# Patient Record
Sex: Female | Born: 1965 | Race: White | Hispanic: No | Marital: Married | Smoking: Former smoker
Health system: Southern US, Community
[De-identification: ages and names within clinical notes are randomized; demographics above are authoritative.]

## PROBLEM LIST (undated history)

## (undated) DIAGNOSIS — T8859XA Other complications of anesthesia, initial encounter: Secondary | ICD-10-CM

## (undated) DIAGNOSIS — K219 Gastro-esophageal reflux disease without esophagitis: Secondary | ICD-10-CM

## (undated) DIAGNOSIS — E039 Hypothyroidism, unspecified: Secondary | ICD-10-CM

## (undated) DIAGNOSIS — T4145XA Adverse effect of unspecified anesthetic, initial encounter: Secondary | ICD-10-CM

## (undated) HISTORY — DX: Hypothyroidism, unspecified: E03.9

## (undated) HISTORY — PX: LUNG CANCER SURGERY: SHX702

## (undated) HISTORY — PX: LUNG SURGERY: SHX703

---
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
## 2001-02-20 ENCOUNTER — Inpatient Hospital Stay (HOSPITAL_COMMUNITY): Admission: AD | Admit: 2001-02-20 | Discharge: 2001-02-25 | Payer: Self-pay | Admitting: Thoracic Surgery

## 2001-02-21 ENCOUNTER — Encounter: Payer: Self-pay | Admitting: Thoracic Surgery

## 2001-02-22 ENCOUNTER — Encounter: Payer: Self-pay | Admitting: Thoracic Surgery

## 2001-02-23 ENCOUNTER — Encounter: Payer: Self-pay | Admitting: Thoracic Surgery

## 2001-02-24 ENCOUNTER — Encounter: Payer: Self-pay | Admitting: Thoracic Surgery

## 2001-02-25 ENCOUNTER — Encounter: Payer: Self-pay | Admitting: Thoracic Surgery

## 2001-02-28 ENCOUNTER — Encounter: Admission: RE | Admit: 2001-02-28 | Discharge: 2001-02-28 | Payer: Self-pay | Admitting: Thoracic Surgery

## 2001-02-28 ENCOUNTER — Encounter: Payer: Self-pay | Admitting: Thoracic Surgery

## 2001-03-28 ENCOUNTER — Encounter: Admission: RE | Admit: 2001-03-28 | Discharge: 2001-03-28 | Payer: Self-pay | Admitting: Thoracic Surgery

## 2001-03-28 ENCOUNTER — Encounter: Payer: Self-pay | Admitting: Thoracic Surgery

## 2005-09-14 ENCOUNTER — Encounter: Admission: RE | Admit: 2005-09-14 | Discharge: 2005-09-14 | Payer: Self-pay | Admitting: General Surgery

## 2014-01-24 DIAGNOSIS — C3490 Malignant neoplasm of unspecified part of unspecified bronchus or lung: Secondary | ICD-10-CM

## 2014-01-24 HISTORY — DX: Malignant neoplasm of unspecified part of unspecified bronchus or lung: C34.90

## 2014-08-08 ENCOUNTER — Other Ambulatory Visit (HOSPITAL_COMMUNITY): Payer: Self-pay | Admitting: Family Medicine

## 2014-08-08 DIAGNOSIS — R918 Other nonspecific abnormal finding of lung field: Secondary | ICD-10-CM

## 2014-08-18 ENCOUNTER — Ambulatory Visit (HOSPITAL_COMMUNITY): Admission: RE | Admit: 2014-08-18 | Payer: 59 | Source: Ambulatory Visit

## 2014-08-27 ENCOUNTER — Encounter (HOSPITAL_COMMUNITY)
Admission: RE | Admit: 2014-08-27 | Discharge: 2014-08-27 | Disposition: A | Discharge status: Home / Self Care | Payer: 59 | Source: Ambulatory Visit | Attending: Family Medicine | Admitting: Family Medicine

## 2014-08-27 DIAGNOSIS — R911 Solitary pulmonary nodule: Secondary | ICD-10-CM | POA: Diagnosis not present

## 2014-08-27 DIAGNOSIS — J8410 Pulmonary fibrosis, unspecified: Secondary | ICD-10-CM | POA: Insufficient documentation

## 2014-08-27 DIAGNOSIS — R918 Other nonspecific abnormal finding of lung field: Secondary | ICD-10-CM | POA: Insufficient documentation

## 2014-08-27 LAB — GLUCOSE, CAPILLARY: GLUCOSE-CAPILLARY: 88 mg/dL (ref 65–99)

## 2014-08-27 MED ORDER — FLUDEOXYGLUCOSE F - 18 (FDG) INJECTION
6.5000 | Freq: Once | INTRAVENOUS | Status: AC | PRN
  Administered 2014-08-27: 6.5 via INTRAVENOUS

## 2015-11-03 IMAGING — CT NM PET TUM IMG INITIAL (PI) SKULL BASE T - THIGH
1 of 7 series · 1 of 25 positions shown · non-contrast
Comparison: None.

CLINICAL DATA: Initial Treatment strategy for Lung cancer.

EXAM:
NUCLEAR MEDICINE PET SKULL BASE TO THIGH
TECHNIQUE: 6.5 mCi F-18 FDG was injected intravenously. Full-ring PET imaging
was performed from the skull base to thigh after the radiotracer. CT
data was obtained and used for attenuation correction and anatomic
localization.
FASTING BLOOD GLUCOSE:  Value: 88 mg/dl

[Series 4: ct sk_thigh 5.0 b31f · axial · 5.0mm · 0.83mm/px · 1 of 220 slices shown]
[im 220/220  brain]
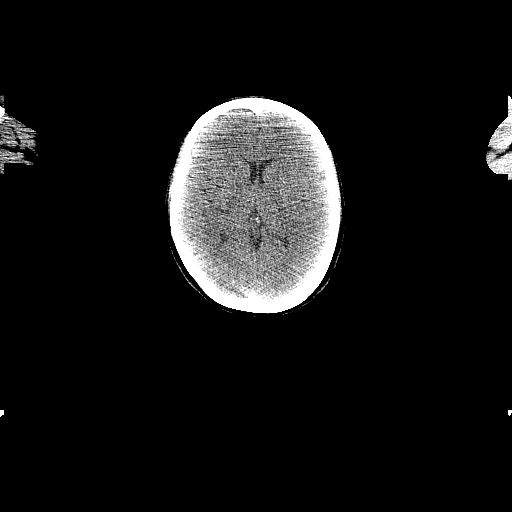

[1 of 25 positions shown; findings below may reference images not displayed]

FINDINGS: NECK

No hypermetabolic lymph nodes in the neck.

CHEST

Right upper lobe lung mass measures 4.4 cm in maximum dimension and
has a SUV max equal to 24.9. There is direct invasion into the
posterior chest wall with destruction of a portion of the third rib.
Hypermetabolic pleural lesion is identified along the medial apex
measuring 8 mm. This has an SUV max equal to 7.2 and is worrisome
for trans pleural spread of tumor.

No hypermetabolic mediastinal or hilar lymph nodes. Calcified
granuloma is identified in the right upper lobe.

ABDOMEN/PELVIS

No abnormal hypermetabolic activity within the liver, pancreas,
adrenal glands, or spleen. No hypermetabolic lymph nodes in the
abdomen or pelvis.

SKELETON

No focal hypermetabolic activity to suggest skeletal metastasis.
IMPRESSION: 1. Invasive mass involving the posterior right upper lobe is
intensely hypermetabolic and compatible with primary bronchogenic
carcinoma. There is direct invasion into the posterior chest wall
with destruction of the right third rib.
2. Second pleural nodule is identified along the medial apex of the
right lung are worrisome for trans pleural spread of tumor.
3. No evidence for distant metastatic disease.

## 2018-06-28 ENCOUNTER — Encounter: Payer: Self-pay | Admitting: *Deleted

## 2018-08-29 ENCOUNTER — Ambulatory Visit: Payer: 59

## 2018-10-22 ENCOUNTER — Ambulatory Visit (INDEPENDENT_AMBULATORY_CARE_PROVIDER_SITE_OTHER): Payer: Self-pay | Admitting: *Deleted

## 2018-10-22 ENCOUNTER — Other Ambulatory Visit: Payer: Self-pay

## 2018-10-22 DIAGNOSIS — Z1211 Encounter for screening for malignant neoplasm of colon: Secondary | ICD-10-CM

## 2018-10-22 MED ORDER — NA SULFATE-K SULFATE-MG SULF 17.5-3.13-1.6 GM/177ML PO SOLN: 1.0000 | Freq: Once | ORAL | 0 refills | Status: AC

## 2018-10-22 NOTE — Patient Instructions (Signed)
Tanya Woods  May 31, 1965 MRN: [id number]     Procedure Date: 01/15/2019 Time to register: 9:30 am Place to register: [facility] Procedure Time: 10:30 am Scheduled provider: Dr. Gala Romney    PREPARATION FOR COLONOSCOPY WITH SUPREP BOWEL PREP KIT  Note: Suprep Bowel Prep Kit is a split-dose (2day) regimen. Consumption of BOTH 6-ounce bottles is required for a complete prep.  Please notify us immediately if you are diabetic, take iron supplements, or if you are on Coumadin or any other blood thinners.                                                                                                                                                  2 DAYS BEFORE PROCEDURE:  DATE: 01/13/2019  DAY: Sunday Begin clear liquid diet AFTER your lunch meal. NO SOLID FOODS after this point.  1 DAY BEFORE PROCEDURE:  DATE: 01/14/2019   DAY: Monday Continue clear liquids the entire day - NO SOLID FOOD.    At 6:00pm: Complete steps 1 through 4 below, using ONE (1) 6-ounce bottle, before going to bed. Step 1:  Pour ONE (1) 6-ounce bottle of SUPREP liquid into the mixing container.  Step 2:  Add cool drinking water to the 16 ounce line on the container and mix.  Note: Dilute the solution concentrate as directed prior to use. Step 3:  DRINK ALL the liquid in the container. Step 4:  You MUST drink an additional two (2) or more 16 ounce containers of water over the next one (1) hour.   Continue clear liquids.  DAY OF PROCEDURE:   DATE: 01/15/2019   DAY:  Tuesday If you take medications for your heart, blood pressure, or breathing, you may take these medications.    5 hours before your procedure at :  5:30 am Step 1:  Pour ONE (1) 6-ounce bottle of SUPREP liquid into the mixing container.  Step 2:  Add cool drinking water to the 16 ounce line on the container and mix.  Note: Dilute the solution concentrate as directed prior to use. Step 3:  DRINK ALL the liquid in the container. Step 4:  You  MUST drink an additional two (2) or more 16 ounce containers of water over the next one (1) hour. You MUST complete the final glass of water at least 3 hours before your colonoscopy. Nothing by mouth past 7:30 am  You may take your morning medications with sip of water unless we have instructed otherwise.    Please see below for Dietary Information.  CLEAR LIQUIDS INCLUDE:  Water Jello (NOT red in color)   Ice Popsicles (NOT red in color)   Tea (sugar ok, no milk/cream) Powdered fruit flavored drinks  Coffee (sugar ok, no milk/cream) Gatorade/ Lemonade/ Kool-Aid  (NOT red in color)   Juice: apple, white grape, white cranberry  Soft drinks  Clear bullion, consomme, broth (fat free beef/chicken/vegetable)  Carbonated beverages (any kind)  Strained chicken noodle soup Hard Candy   Remember: Clear liquids are liquids that will allow you to see your fingers on the other side of a clear glass. Be sure liquids are NOT red in color, and not cloudy, but CLEAR.  DO NOT EAT OR DRINK ANY OF THE FOLLOWING:  Dairy products of any kind   Cranberry juice Tomato juice / V8 juice   Grapefruit juice Orange juice     Red grape juice  Do not eat any solid foods, including such foods as: cereal, oatmeal, yogurt, fruits, vegetables, creamed soups, eggs, bread, crackers, pureed foods in a blender, etc.   HELPFUL HINTS FOR DRINKING PREP SOLUTION:   Make sure prep is extremely cold. Mix and refrigerate the the morning of the prep. You may also put in the freezer.   You may try mixing some Crystal Light or Country Time Lemonade if you prefer. Mix in small amounts; add more if necessary.  Try drinking through a straw  Rinse mouth with water or a mouthwash between glasses, to remove after-taste.  Try sipping on a cold beverage /ice/ popsicles between glasses of prep.  Place a piece of sugar-free hard candy in mouth between glasses.  If you become nauseated, try consuming smaller amounts, or stretch out  the time between glasses. Stop for 30-60 minutes, then slowly start back drinking.     OTHER INSTRUCTIONS  You will need a responsible adult at least 51 years of age to accompany you and drive you home. This person must remain in the waiting room during your procedure. The hospital will cancel your procedure if you do not have a responsible adult with you.   1. Wear loose fitting clothing that is easily removed. 2. Leave jewelry and other valuables at home.  3. Remove all body piercing jewelry and leave at home. 4. Total time from sign-in until discharge is approximately 2-3 hours. 5. You should go home directly after your procedure and rest. You can resume normal activities the day after your procedure. 6. The day of your procedure you should not:  Drive  Make legal decisions  Operate machinery  Drink alcohol  Return to work   You may call the office (Dept: [phone]) before 5:00pm, or page the doctor on call [phone]) after 5:00pm, for further instructions, if necessary.   Insurance Information YOU WILL NEED TO CHECK WITH YOUR INSURANCE COMPANY FOR THE BENEFITS OF COVERAGE YOU HAVE FOR THIS PROCEDURE.  UNFORTUNATELY, NOT ALL INSURANCE COMPANIES HAVE BENEFITS TO COVER ALL OR PART OF THESE TYPES OF PROCEDURES.  IT IS YOUR RESPONSIBILITY TO CHECK YOUR BENEFITS, HOWEVER, WE WILL BE GLAD TO ASSIST YOU WITH ANY CODES YOUR INSURANCE COMPANY MAY NEED.    PLEASE NOTE THAT MOST INSURANCE COMPANIES WILL NOT COVER A SCREENING COLONOSCOPY FOR PEOPLE UNDER THE AGE OF 50  IF YOU HAVE BCBS INSURANCE, YOU MAY HAVE BENEFITS FOR A SCREENING COLONOSCOPY BUT IF POLYPS ARE FOUND THE DIAGNOSIS WILL CHANGE AND THEN YOU MAY HAVE A DEDUCTIBLE THAT WILL NEED TO BE MET. SO PLEASE MAKE SURE YOU CHECK YOUR BENEFITS FOR A SCREENING COLONOSCOPY AS WELL AS A DIAGNOSTIC COLONOSCOPY.

## 2018-10-22 NOTE — Progress Notes (Addendum)
Gastroenterology Pre-Procedure Review  Request Date: 10/22/2018 Requesting Physician: Dr. Quillian Quince, no previous TCS  PATIENT REVIEW QUESTIONS: The patient responded to the following health history questions as indicated:    1. Diabetes Melitis: no 2. Joint replacements in the past 12 months: no 3. Major health problems in the past 3 months: no 4. Has an artificial valve or MVP: no 5. Has a defibrillator: no 6. Has been advised in past to take antibiotics in advance of a procedure like teeth cleaning: no 7. Family history of colon cancer: no  8. Alcohol Use: no 9. Illicit drug Use: no 10. History of sleep apnea: no  11. History of coronary artery or other vascular stents placed within the last 12 months: no 12. History of any prior anesthesia complications: yes, hard to wake 4 years ago 13. There is no height or weight on file to calculate BMI. ht: 5'6  Wt: 177 lbs    MEDICATIONS & ALLERGIES:    Patient reports the following regarding taking any blood thinners:   Plavix? no Aspirin? no Coumadin? no Brilinta? no Xarelto? no Eliquis? no Pradaxa? no Savaysa? no Effient? no  Patient confirms/reports the following medications:  Current Outpatient Medications  Medication Sig Dispense Refill  . diphenhydrAMINE (BENADRYL ALLERGY) 25 mg capsule Take 25 mg by mouth every 6 (six) hours as needed. Takes 1 to 2 tablets daily.    Marland Kitchen levothyroxine (SYNTHROID) 50 MCG tablet Take 50 mcg by mouth daily before breakfast.    . Multiple Vitamins-Minerals (MULTIVITAMIN ADULT) TABS Take by mouth daily.    . naproxen sodium (ALEVE) 220 MG tablet Take 220 mg by mouth as needed.    Marland Kitchen omeprazole (PRILOSEC) 20 MG capsule Take 20 mg by mouth daily.     No current facility-administered medications for this visit.     Patient confirms/reports the following allergies:  Allergies  Allergen Reactions  . Morphine And Related Nausea Only  . Tramadol Nausea Only    No orders of the defined types were  placed in this encounter.   [place] INFORMATION Primary Insurance: [facility],  [place] #: [id number],  Group #: [id number] Pre-Cert / Auth required: Yes, approved online 01-15-2019 to [phone] Pre-Cert / Auth #: [id number]  SCHEDULE INFORMATION: Procedure has been scheduled as follows:  Date: 01/15/2019, Time: 10:30 Location: [facility] with Dr. Gala Romney  This Gastroenterology Pre-Precedure Review Form is being routed to the following provider(s): Roseanne Kaufman, NP

## 2018-10-24 NOTE — Progress Notes (Signed)
What procedure was difficult with anesthesia? May be best to just to see her in the office.

## 2018-10-25 NOTE — Progress Notes (Addendum)
Called pt and the procedure was due to lung cancer.

## 2018-10-29 NOTE — Progress Notes (Signed)
Appropriate. I reviewed Care Everywhere, and there was no mention of this as an issue.

## 2018-10-30 NOTE — Addendum Note (Signed)
Addended by: Metro Kung on: 10/30/2018 12:42 PM   Modules accepted: Orders, SmartSet

## 2019-01-11 ENCOUNTER — Other Ambulatory Visit (HOSPITAL_COMMUNITY)
Admission: RE | Admit: 2019-01-11 | Discharge: 2019-01-11 | Disposition: A | Discharge status: Home / Self Care | Payer: 59 | Source: Ambulatory Visit | Attending: Internal Medicine | Admitting: Internal Medicine

## 2019-01-11 ENCOUNTER — Other Ambulatory Visit: Payer: Self-pay

## 2019-01-11 ENCOUNTER — Other Ambulatory Visit (HOSPITAL_COMMUNITY): Payer: 59

## 2019-01-11 DIAGNOSIS — Z20828 Contact with and (suspected) exposure to other viral communicable diseases: Secondary | ICD-10-CM | POA: Diagnosis not present

## 2019-01-11 DIAGNOSIS — Z01812 Encounter for preprocedural laboratory examination: Secondary | ICD-10-CM | POA: Diagnosis not present

## 2019-01-11 LAB — SARS CORONAVIRUS 2 (TAT 6-24 HRS): SARS Coronavirus 2: NEGATIVE

## 2019-01-15 ENCOUNTER — Encounter (HOSPITAL_COMMUNITY)
Admission: RE | Disposition: A | Discharge status: Home / Self Care | Payer: Self-pay | Source: Home / Self Care | Attending: Internal Medicine

## 2019-01-15 ENCOUNTER — Encounter (HOSPITAL_COMMUNITY): Payer: Self-pay | Admitting: Internal Medicine

## 2019-01-15 ENCOUNTER — Other Ambulatory Visit: Payer: Self-pay

## 2019-01-15 ENCOUNTER — Ambulatory Visit (HOSPITAL_COMMUNITY)
Admission: RE | Admit: 2019-01-15 | Discharge: 2019-01-15 | Disposition: A | Discharge status: Home / Self Care | Payer: 59 | Attending: Internal Medicine | Admitting: Internal Medicine

## 2019-01-15 DIAGNOSIS — Z79890 Hormone replacement therapy (postmenopausal): Secondary | ICD-10-CM | POA: Insufficient documentation

## 2019-01-15 DIAGNOSIS — D120 Benign neoplasm of cecum: Secondary | ICD-10-CM | POA: Insufficient documentation

## 2019-01-15 DIAGNOSIS — Z885 Allergy status to narcotic agent status: Secondary | ICD-10-CM | POA: Insufficient documentation

## 2019-01-15 DIAGNOSIS — K219 Gastro-esophageal reflux disease without esophagitis: Secondary | ICD-10-CM | POA: Insufficient documentation

## 2019-01-15 DIAGNOSIS — Z1211 Encounter for screening for malignant neoplasm of colon: Secondary | ICD-10-CM | POA: Diagnosis not present

## 2019-01-15 DIAGNOSIS — K621 Rectal polyp: Secondary | ICD-10-CM | POA: Diagnosis not present

## 2019-01-15 DIAGNOSIS — K635 Polyp of colon: Secondary | ICD-10-CM | POA: Diagnosis not present

## 2019-01-15 DIAGNOSIS — D124 Benign neoplasm of descending colon: Secondary | ICD-10-CM | POA: Insufficient documentation

## 2019-01-15 DIAGNOSIS — Z79899 Other long term (current) drug therapy: Secondary | ICD-10-CM | POA: Insufficient documentation

## 2019-01-15 DIAGNOSIS — Z87891 Personal history of nicotine dependence: Secondary | ICD-10-CM | POA: Diagnosis not present

## 2019-01-15 HISTORY — PX: COLONOSCOPY: SHX5424

## 2019-01-15 HISTORY — DX: Other complications of anesthesia, initial encounter: T88.59XA

## 2019-01-15 HISTORY — DX: Gastro-esophageal reflux disease without esophagitis: K21.9

## 2019-01-15 HISTORY — PX: BIOPSY: SHX5522

## 2019-01-15 HISTORY — PX: POLYPECTOMY: SHX5525

## 2019-01-15 SURGERY — COLONOSCOPY
Anesthesia: Moderate Sedation

## 2019-01-15 MED ORDER — ONDANSETRON HCL 4 MG/2ML IJ SOLN
INTRAMUSCULAR | Status: AC
  Filled 2019-01-15: qty 2

## 2019-01-15 MED ORDER — MIDAZOLAM HCL 5 MG/5ML IJ SOLN
INTRAMUSCULAR | Status: AC
  Filled 2019-01-15: qty 10

## 2019-01-15 MED ORDER — MEPERIDINE HCL 50 MG/ML IJ SOLN
INTRAMUSCULAR | Status: AC
  Filled 2019-01-15: qty 1

## 2019-01-15 MED ORDER — SODIUM CHLORIDE 0.9 % IV SOLN: INTRAVENOUS | Status: DC

## 2019-01-15 MED ORDER — MEPERIDINE HCL 100 MG/ML IJ SOLN
INTRAMUSCULAR | Status: DC | PRN
  Administered 2019-01-15: 15 mg via INTRAVENOUS
  Administered 2019-01-15: 25 mg via INTRAVENOUS

## 2019-01-15 MED ORDER — ONDANSETRON HCL 4 MG/2ML IJ SOLN
INTRAMUSCULAR | Status: DC | PRN
  Administered 2019-01-15: 4 mg via INTRAVENOUS

## 2019-01-15 MED ORDER — STERILE WATER FOR IRRIGATION IR SOLN
Status: DC | PRN
  Administered 2019-01-15: 1.5 mL

## 2019-01-15 MED ORDER — MIDAZOLAM HCL 5 MG/5ML IJ SOLN
INTRAMUSCULAR | Status: DC | PRN
  Administered 2019-01-15 (×6): 1 mg via INTRAVENOUS
  Administered 2019-01-15: 2 mg via INTRAVENOUS

## 2019-01-15 NOTE — H&P (Signed)
@LOGO @   Primary Care Physician:  Caryl Bis, MD Primary Gastroenterologist:  Dr. Gala Romney  Pre-Procedure History & Physical: HPI:  Tanya Woods is a 53 y.o. female is here for a screening colonoscopy.  No bowel symptoms.  No family history of colon cancer.  No prior colonoscopy.  Past Medical History:  Diagnosis Date  . Complication of anesthesia   . GERD (gastroesophageal reflux disease)     Prior to Admission medications   Medication Sig Start Date End Date Taking? Authorizing Provider  diphenhydrAMINE (BENADRYL ALLERGY) 25 mg capsule Take 25 mg by mouth every 6 (six) hours as needed. Takes 1 to 2 tablets daily.   Yes [provider]  levothyroxine (SYNTHROID) 50 MCG tablet Take 50 mcg by mouth daily before breakfast.   Yes [provider]  Multiple Vitamins-Minerals (MULTIVITAMIN ADULT) TABS Take 1 tablet by mouth daily.    Yes [provider]  naproxen sodium (ALEVE) 220 MG tablet Take 220 mg by mouth 2 (two) times daily as needed (pain).    Yes [provider]  omeprazole (PRILOSEC) 20 MG capsule Take 20 mg by mouth daily.   Yes [provider]    Allergies as of 10/30/2018 - Review Complete 08/27/2014  Allergen Reaction Noted  . Morphine and related Nausea Only 10/22/2018  . Tramadol Nausea Only 10/22/2018    No family history on file.  Social History   Socioeconomic History  . Marital status: Married    Spouse name: Not on file  . Number of children: Not on file  . Years of education: Not on file  . Highest education level: Not on file  Occupational History  . Not on file  Tobacco Use  . Smoking status: Former Research scientist (life sciences)  . Smokeless tobacco: Never Used  Substance and Sexual Activity  . Alcohol use: Yes    Comment: occasion  . Drug use: Never  . Sexual activity: Not on file  Other Topics Concern  . Not on file  Social History Narrative  . Not on file   Social Determinants of Health   Financial Resource  Strain:   . Difficulty of Paying Living Expenses: Not on file  Food Insecurity:   . Worried About Charity fundraiser in the Last Year: Not on file  . Ran Out of Food in the Last Year: Not on file  Transportation Needs:   . Lack of Transportation (Medical): Not on file  . Lack of Transportation (Non-Medical): Not on file  Physical Activity:   . Days of Exercise per Week: Not on file  . Minutes of Exercise per Session: Not on file  Stress:   . Feeling of Stress : Not on file  Social Connections:   . Frequency of Communication with Friends and Family: Not on file  . Frequency of Social Gatherings with Friends and Family: Not on file  . Attends Religious Services: Not on file  . Active Member of Clubs or Organizations: Not on file  . Attends Archivist Meetings: Not on file  . Marital Status: Not on file  Intimate Partner Violence:   . Fear of Current or Ex-Partner: Not on file  . Emotionally Abused: Not on file  . Physically Abused: Not on file  . Sexually Abused: Not on file    Review of Systems: See HPI, otherwise negative ROS  Physical Exam: BP 116/65   Pulse 79   Temp (!) 97.5 F (36.4 C) (Oral)   Resp 19  Ht 5\' 6"  (1.676 m)   SpO2 99%  General:   Alert,  Well-developed, well-nourished, pleasant and cooperative in NAD Head:  Normocephalic and atraumatic. Lungs:  Clear throughout to auscultation.   No wheezes, crackles, or rhonchi. No acute distress. Heart:  Regular rate and rhythm; no murmurs, clicks, rubs,  or gallops. Abdomen:  Soft, nontender and nondistended. No masses, hepatosplenomegaly or hernias noted. Normal bowel sounds, without guarding, and without rebound.    Impression/Plan: Tanya Woods is now here to undergo a screening colonoscopy.  First ever average rescreening examination.  Risks, benefits, limitations, imponderables and alternatives regarding colonoscopy have been reviewed with the patient. Questions have been answered. All parties  agreeable.     Notice:  This dictation was prepared with Dragon dictation along with smaller phrase technology. Any transcriptional errors that result from this process are unintentional and may not be corrected upon review.

## 2019-01-15 NOTE — Discharge Instructions (Signed)
Colonoscopy Discharge Instructions  Read the instructions outlined below and refer to this sheet in the next few weeks. These discharge instructions provide you with general information on caring for yourself after you leave the hospital. Your doctor may also give you specific instructions. While your treatment has been planned according to the most current medical practices available, unavoidable complications occasionally occur. If you have any problems or questions after discharge, call Dr. Gala Romney at [phone]. ACTIVITY  You may resume your regular activity, but move at a slower pace for the next 24 hours.   Take frequent rest periods for the next 24 hours.   Walking will help get rid of the air and reduce the bloated feeling in your belly (abdomen).   No driving for 24 hours (because of the medicine (anesthesia) used during the test).    Do not sign any important legal documents or operate any machinery for 24 hours (because of the anesthesia used during the test).  NUTRITION  Drink plenty of fluids.   You may resume your normal diet as instructed by your doctor.   Begin with a light meal and progress to your normal diet. Heavy or fried foods are harder to digest and may make you feel sick to your stomach (nauseated).   Avoid alcoholic beverages for 24 hours or as instructed.  MEDICATIONS  You may resume your normal medications unless your doctor tells you otherwise.  WHAT YOU CAN EXPECT TODAY  Some feelings of bloating in the abdomen.   Passage of more gas than usual.   Spotting of blood in your stool or on the toilet paper.  IF YOU HAD POLYPS REMOVED DURING THE COLONOSCOPY:  No aspirin products for 7 days or as instructed.   No alcohol for 7 days or as instructed.   Eat a soft diet for the next 24 hours.  FINDING OUT THE RESULTS OF YOUR TEST Not all test results are available during your visit. If your test results are not back during the visit, make an appointment  with your caregiver to find out the results. Do not assume everything is normal if you have not heard from your caregiver or the medical facility. It is important for you to follow up on all of your test results.  SEEK IMMEDIATE MEDICAL ATTENTION IF:  You have more than a spotting of blood in your stool.   Your belly is swollen (abdominal distention).   You are nauseated or vomiting.   You have a temperature over 101.   You have abdominal pain or discomfort that is severe or gets worse throughout the day.     Colon polyp information provided  Further recommendations to follow pending review of pathology report  No future MRI until clips gone  You may experience some mild rectal bleeding with your next bowel movement or 2 but it should taper off  Patient request, I spoke to [place] at [phone] and reviewed results   Colon Polyps  Polyps are tissue growths inside the body. Polyps can grow in many places, including the large intestine (colon). A polyp may be a round bump or a mushroom-shaped growth. You could have one polyp or several. Most colon polyps are noncancerous (benign). However, some colon polyps can become cancerous over time. Finding and removing the polyps early can help prevent this. What are the causes? The exact cause of colon polyps is not known. What increases the risk? You are more likely to develop this condition if you:  Have a  family history of colon cancer or colon polyps.  Are older than 69 or older than 45 if you are African American.  Have inflammatory bowel disease, such as ulcerative colitis or Crohn's disease.  Have certain hereditary conditions, such as: ? Familial adenomatous polyposis. ? Lynch syndrome. ? Turcot syndrome. ? Peutz-Jeghers syndrome.  Are overweight.  Smoke cigarettes.  Do not get enough exercise.  Drink too much alcohol.  Eat a diet that is high in fat and red meat and low in fiber.  Had childhood cancer that was  treated with abdominal radiation. What are the signs or symptoms? Most polyps do not cause symptoms. If you have symptoms, they may include:  Blood coming from your rectum when having a bowel movement.  Blood in your stool. The stool may look dark red or black.  Abdominal pain.  A change in bowel habits, such as constipation or diarrhea. How is this diagnosed? This condition is diagnosed with a colonoscopy. This is a procedure in which a lighted, flexible scope is inserted into the anus and then passed into the colon to examine the area. Polyps are sometimes found when a colonoscopy is done as part of routine cancer screening tests. How is this treated? Treatment for this condition involves removing any polyps that are found. Most polyps can be removed during a colonoscopy. Those polyps will then be tested for cancer. Additional treatment may be needed depending on the results of testing. Follow these instructions at home: Lifestyle  Maintain a healthy weight, or lose weight if recommended by your health care provider.  Exercise every day or as told by your health care provider.  Do not use any products that contain nicotine or tobacco, such as cigarettes and e-cigarettes. If you need help quitting, ask your health care provider.  If you drink alcohol, limit how much you have: ? 0-1 drink a day for women. ? 0-2 drinks a day for men.  Be aware of how much alcohol is in your drink. In the [place] one drink equals one 12 oz bottle of beer (355 mL), one 5 oz glass of wine (148 mL), or one 1 oz shot of hard liquor (44 mL). Eating and drinking   Eat foods that are high in fiber, such as fruits, vegetables, and whole grains.  Eat foods that are high in calcium and vitamin D, such as milk, cheese, yogurt, eggs, liver, fish, and broccoli.  Limit foods that are high in fat, such as fried foods and desserts.  Limit the amount of red meat and processed meat you eat, such as hot dogs,  sausage, bacon, and lunch meats. General instructions  Keep all follow-up visits as told by your health care provider. This is important. ? This includes having regularly scheduled colonoscopies. ? Talk to your health care provider about when you need a colonoscopy. Contact a health care provider if:  You have new or worsening bleeding during a bowel movement.  You have new or increased blood in your stool.  You have a change in bowel habits.  You lose weight for no known reason. Summary  Polyps are tissue growths inside the body. Polyps can grow in many places, including the colon.  Most colon polyps are noncancerous (benign), but some can become cancerous over time.  This condition is diagnosed with a colonoscopy.  Treatment for this condition involves removing any polyps that are found. Most polyps can be removed during a colonoscopy. This information is not intended to replace advice given  to you by your health care provider. Make sure you discuss any questions you have with your health care provider. Document Released: 10/07/2003 Document Revised: 04/27/2017 Document Reviewed: 04/27/2017 Elsevier Patient Education  2020 [employer].

## 2019-01-15 NOTE — Op Note (Signed)
[facility] Patient Name: Tanya Woods Procedure Date: 01/15/2019 9:47 AM MRN: [id number] Date of Birth: May 20, 1965 Attending MD: Norvel Richards , MD CSN: [id number] Age: 53 Admit Type: Outpatient Procedure:                Colonoscopy Indications:              Screening for colorectal malignant neoplasm Providers:                Norvel Richards, MD, Otis Peak B. Sharon Seller, RN,                            Randa Spike, Technician Referring MD:              Medicines:                Midazolam 8 mg IV, Meperidine 40 mg IV Complications:            No immediate complications. Estimated Blood Loss:     Estimated blood loss was minimal. Procedure:                Pre-Anesthesia Assessment:                           - Prior to the procedure, a History and Physical                            was performed, and patient medications and                            allergies were reviewed. The patient's tolerance of                            previous anesthesia was also reviewed. The risks                            and benefits of the procedure and the sedation                            options and risks were discussed with the patient.                            All questions were answered, and informed consent                            was obtained. Prior Anticoagulants: The patient has                            taken no previous anticoagulant or antiplatelet                            agents. ASA Grade Assessment: II - A patient with                            mild systemic disease. After reviewing the risks  and benefits, the patient was deemed in                            satisfactory condition to undergo the procedure.                           After obtaining informed consent, the colonoscope                            was passed under direct vision. Throughout the                            procedure, the patient's blood pressure, pulse, and                           oxygen saturations were monitored continuously. The                            CF-HQ190L ([id number]) scope was introduced through                            the anus and advanced to the the cecum, identified                            by appendiceal orifice and ileocecal valve. The                            colonoscopy was performed without difficulty. The                            patient tolerated the procedure well. The quality                            of the bowel preparation was adequate. Scope In: 10:13:16 AM Scope Out: 10:47:07 AM Scope Withdrawal Time: 0 hours 30 minutes 2 seconds  Total Procedure Duration: 0 hours 33 minutes 51 seconds  Findings:      The perianal and digital rectal examinations were normal. 1 cm irregular       firm nodule just inside the anal verge. Firm to biopsy forcep palpation.       Subtle on DRE      Two semi-pedunculated polyps were found in the descending colon and       cecum. The polyps were 4 to 6 mm in size. Removed with cold snare.      A 14 mm polyp was found in the cecum. The polyp was sessile. Total of 8       cc of Eloview injected into the large cecal polypectomy site. Lifted       nicely. The polyp was removed with a hot snare. X2 passes with complete       resection. Estimated blood loss: Minimal.. Clipped x2 to ensure       hemostasis      An area of nodular mucosa was found in the distal rectum. Cold biopsy       x1. Estimated blood loss minimal      The exam was otherwise without  abnormality on direct and retroflexion       views. Impression:               - Two 4 to 6 mm polyps in the descending colon and                            in the cecum.                           - One 14 mm polyp in the cecum, removed with a hot                            snare. Resected and retrieved. Status post clipping                           - Nodule -distal rectum. S/P biopsy                           - The examination was  otherwise normal on direct                            and retroflexion views. Moderate Sedation:      Moderate (conscious) sedation was administered by the endoscopy nurse       and supervised by the endoscopist. The following parameters were       monitored: oxygen saturation, heart rate, blood pressure, respiratory       rate, EKG, adequacy of pulmonary ventilation, and response to care.       Total physician intraservice time was 39 minutes. Recommendation:           - Patient has a contact number available for                            emergencies. The signs and symptoms of potential                            delayed complications were discussed with the                            patient. Return to normal activities tomorrow.                            Written discharge instructions were provided to the                            patient.                           - Advance diet as tolerated.                           - Continue present medications. No MRI until clips                            gone.                           -  Repeat colonoscopy date to be determined after                            pending pathology results are reviewed for                            surveillance.                           - Return to GI office (date not yet determined).                            Follow-up on pathology. Procedure Code(s):        --- Professional ---                           [phone], Colonoscopy, flexible; with removal of                            tumor(s), polyp(s), or other lesion(s) by snare                            technique                           99153, Moderate sedation; each additional 15                            minutes intraservice time                           99153, Moderate sedation; each additional 15                            minutes intraservice time                           G0500, Moderate sedation services provided by the                             same physician or other qualified health care                            professional performing a gastrointestinal                            endoscopic service that sedation supports,                            requiring the presence of an independent trained                            observer to assist in the monitoring of the                            patient's level of consciousness and physiological  status; initial 15 minutes of intra-service time;                            patient age 67 years or older (additional time may                            be reported with [phone], as appropriate) Diagnosis Code(s):        --- Professional ---                           K63.5, Polyp of colon                           Z12.11, Encounter for screening for malignant                            neoplasm of colon                           K62.89, Other specified diseases of anus and rectum CPT copyright 2019 American Medical Association. All rights reserved. The codes documented in this report are preliminary and upon coder review may  be revised to meet current compliance requirements. Cristopher Estimable. Geroldine Esquivias, MD Norvel Richards, MD 01/15/2019 11:48:13 AM This report has been signed electronically. Number of Addenda: 0

## 2019-01-19 LAB — SURGICAL PATHOLOGY

## 2019-01-24 ENCOUNTER — Encounter: Payer: Self-pay | Admitting: Internal Medicine

## 2019-01-28 ENCOUNTER — Encounter: Payer: Self-pay | Admitting: Internal Medicine

## 2019-01-28 ENCOUNTER — Telehealth: Payer: Self-pay

## 2019-01-28 NOTE — Telephone Encounter (Signed)
Per RMR- Needs ov in 6 months to set upTCS

## 2019-01-28 NOTE — Telephone Encounter (Signed)
OV made for 07/25/2019 at 0800 with [facility] and letter mailed

## 2019-01-30 ENCOUNTER — Telehealth: Payer: Self-pay | Admitting: Internal Medicine

## 2019-01-30 NOTE — Telephone Encounter (Signed)
Pt was calling to see if her procedure results were available, please call [phone]

## 2019-01-30 NOTE — Telephone Encounter (Signed)
Spoke with pt. Results were mailed earlier this week. Pt notified of results.

## 2019-04-09 ENCOUNTER — Telehealth: Payer: Self-pay | Admitting: Internal Medicine

## 2019-04-09 NOTE — Telephone Encounter (Signed)
I tried to call the patient, no answer unable to leave a message.

## 2019-04-09 NOTE — Telephone Encounter (Signed)
[phone]  PLEASE CALL PATIENT, SHE HAD A FEW QUESTIONS ABOUT POLYPS AND HER NEXT TCS.  ASKED THAT SINCE POLYPS WERE REMOVED AT THIS PROCEDURE WOULD THAT MEAN NEXT PROCEDURE WILL BE A SCREENING?

## 2019-07-25 ENCOUNTER — Ambulatory Visit: Payer: 59 | Admitting: Gastroenterology

## 2019-07-26 ENCOUNTER — Ambulatory Visit: Payer: 59 | Admitting: Gastroenterology

## 2019-09-19 ENCOUNTER — Encounter: Payer: Self-pay | Admitting: Gastroenterology

## 2019-09-19 NOTE — Progress Notes (Signed)
Referring Provider: Caryl Bis, MD Primary Care Physician:  Caryl Bis, MD Primary GI Physician: Dr. Gala Romney  Chief Complaint  Patient presents with  . Colonoscopy    HPI:   Tanya Woods is a 54 y.o. female presenting today to discuss scheduling colonoscopy.  Patient underwent colonoscopy 01/15/2019 for screening purposes and found to have two 4-6 mm polyp in the descending colon and cecum, one 14 mm polyp in the cecum resected and retrieved s/p clipping, nodule in distal rectum s/p biopsy.  Cecal pathology with sessile serrated polyp, descending colon was hyperplastic, rectal biopsy with inflammatory polyp with reactive changes.  Recommended repeat colonoscopy in 6 months.  Today:  No abdominal pain. BMs daily. No constipation or diarrhea. No blood in the stool or black stools. No unintentional weight loss. No N/V. Intermittent GERD symptoms. Taking Prilosec 20 mg daily which works fairly well. Occasionally, she will use tums for breakthrough. Aleve as needed, not regularly. No dysphagia.   No trouble with last procedure.  Past Medical History:  Diagnosis Date  . Complication of anesthesia   . GERD (gastroesophageal reflux disease)   . Hypothyroidism   . Non-small cell lung cancer ([place]) 2016    Past Surgical History:  Procedure Laterality Date  . BIOPSY  01/15/2019   Procedure: BIOPSY;  Surgeon: Daneil Dolin, MD;  [facility]: [facility];  Service: Endoscopy;;  . COLONOSCOPY N/A 01/15/2019   Procedure: COLONOSCOPY;  Surgeon: Daneil Dolin, MD; 2 polyps in the descending colon and cecum (hyperplastic), one 14 mm polyp in cecum (sessile serrated polyp), nodule in distal rectum (inflammatory polyp).  Recommended repeat colonoscopy in 6 months.  Marland Kitchen LUNG CANCER SURGERY Right    [facility]   . LUNG SURGERY     wedge surgery left side   . POLYPECTOMY  01/15/2019   Procedure: POLYPECTOMY;  Surgeon: Daneil Dolin, MD;  Location: [facility];  Service:  Endoscopy;;    Current Outpatient Medications  Medication Sig Dispense Refill  . acetaminophen (TYLENOL) 500 MG tablet Take 500 mg by mouth as needed.    . diphenhydrAMINE (BENADRYL ALLERGY) 25 mg capsule Take 25 mg by mouth every 6 (six) hours as needed. Takes 1 to 2 tablets daily.    Marland Kitchen levothyroxine (SYNTHROID) 50 MCG tablet Take 88 mcg by mouth daily before breakfast. Pt takes 88 mcg daily.    . Multiple Vitamins-Minerals (MULTIVITAMIN ADULT) TABS Take 1 tablet by mouth daily.     . naproxen sodium (ALEVE) 220 MG tablet Take 220 mg by mouth 2 (two) times daily as needed (pain).     Marland Kitchen omeprazole (PRILOSEC) 20 MG capsule Take 20 mg by mouth daily.     No current facility-administered medications for this visit.    Allergies as of 09/20/2019 - Review Complete 09/20/2019  Allergen Reaction Noted  . Morphine and related Nausea Only 10/22/2018  . Tramadol Nausea Only 10/22/2018    Family History  Problem Relation Age of Onset  . Colon polyps Father   . Colon cancer Neg Hx     Social History   Socioeconomic History  . Marital status: Married    Spouse name: Not on file  . Number of children: Not on file  . Years of education: Not on file  . Highest education level: Not on file  Occupational History  . Not on file  Tobacco Use  . Smoking status: Former Research scientist (life sciences)  . Smokeless tobacco: Never Used  Vaping Use  .  Vaping Use: Never used  Substance and Sexual Activity  . Alcohol use: Not Currently    Comment: occasionally  . Drug use: Never  . Sexual activity: Not on file  Other Topics Concern  . Not on file  Social History Narrative  . Not on file   Social Determinants of Health   Financial Resource Strain:   . Difficulty of Paying Living Expenses: Not on file  Food Insecurity:   . Worried About Charity fundraiser in the Last Year: Not on file  . Ran Out of Food in the Last Year: Not on file  Transportation Needs:   . Lack of Transportation (Medical): Not on file  .  Lack of Transportation (Non-Medical): Not on file  Physical Activity:   . Days of Exercise per Week: Not on file  . Minutes of Exercise per Session: Not on file  Stress:   . Feeling of Stress : Not on file  Social Connections:   . Frequency of Communication with Friends and Family: Not on file  . Frequency of Social Gatherings with Friends and Family: Not on file  . Attends Religious Services: Not on file  . Active Member of Clubs or Organizations: Not on file  . Attends Archivist Meetings: Not on file  . Marital Status: Not on file    Review of Systems: Gen: Denies fever, chills, cold or flulike symptoms, lightheadedness, dizziness, presyncope, syncope. CV: Denies chest pain or heart palpitations. Resp: Admits to SOB with exertion. No SOB at rest. No regular cough.  GI: See HPI Heme: See HPI   Physical Exam: BP 118/76   Pulse 67   Temp 97.6 F (36.4 C) (Oral)   Ht 5\' 6"  (1.676 m)   Wt 181 lb 9.6 oz (82.4 kg)   BMI 29.31 kg/m  General:   Alert and oriented. No distress noted. Pleasant and cooperative.  Head:  Normocephalic and atraumatic. Eyes:  Conjuctiva clear without scleral icterus. Heart:  S1, S2 present without murmurs appreciated. Lungs:  Clear to auscultation bilaterally. No wheezes, rales, or rhonchi. No distress.  Abdomen:  +BS, soft, non-tender and non-distended. No rebound or guarding. No HSM or masses noted. Msk:  Symmetrical without gross deformities. Normal posture. Extremities:  Without edema. Neurologic:  Alert and  oriented x4 Psych: Normal mood and affect.

## 2019-09-20 ENCOUNTER — Encounter: Payer: Self-pay | Admitting: Gastroenterology

## 2019-09-20 ENCOUNTER — Other Ambulatory Visit: Payer: Self-pay

## 2019-09-20 ENCOUNTER — Ambulatory Visit: Payer: 59 | Admitting: Gastroenterology

## 2019-09-20 DIAGNOSIS — Z86010 Personal history of colonic polyps: Secondary | ICD-10-CM

## 2019-09-20 NOTE — Assessment & Plan Note (Addendum)
54 year old female presenting today to schedule surveillance colonoscopy.  Last colonoscopy December 2020 for screening purposes with 2 polyps in the descending colon and cecum (hyperplastic), one 14 mm polyp in the cecum (sessile serrated polyp), and one nodule in the distal rectum (inflammatory polyp) with recommendations to repeat colonoscopy in 6 months.  No significant upper or lower GI symptoms at this time.  No alarm symptoms.  No family history of colon cancer.  Father with history of colon polyps.  Plan: Proceed with colonoscopy with Dr. Gala Romney in the near future. The risks, benefits, and alternatives have been discussed with the patient in detail. The patient states understanding and desires to proceed.  ASA II Follow-up as Dr. Gala Romney recommends.

## 2019-09-20 NOTE — Patient Instructions (Signed)
We will get you scheduled for colonoscopy in the near future with Dr. Gala Romney.  We will plan to follow-up with you in the office as Dr. Gala Romney recommends. Do not hesitate to call if you have questions or concerns.  Aliene Altes, PA-C [facility]

## 2019-10-01 ENCOUNTER — Telehealth: Payer: Self-pay | Admitting: *Deleted

## 2019-10-01 NOTE — Telephone Encounter (Signed)
Called pt to schedule TCS with Dr. Gala Romney. Offered several November dates and stated none of those worked for her. She advised me to call back when we have December schedule.

## 2019-10-03 ENCOUNTER — Telehealth: Payer: Self-pay | Admitting: Internal Medicine

## 2019-10-03 NOTE — Telephone Encounter (Signed)
Called pt. She called back to schedule TCS with Dr. Gala Romney. Procedure scheduled for 11/10 at 8:30am, arrival 7:30am. Aware covid will be done prior on 11/9 at [place] will mail instructions. Pt was given plenvu sample at [place].

## 2019-10-03 NOTE — Telephone Encounter (Signed)
[phone]  Patient called about scheduling her procedure

## 2019-10-04 ENCOUNTER — Telehealth: Payer: Self-pay | Admitting: *Deleted

## 2019-10-04 NOTE — Telephone Encounter (Signed)
PA approved via [facility] for TCS. AUTH# [id number] dates 12/04/2019-03/03/2020

## 2019-12-03 ENCOUNTER — Other Ambulatory Visit (HOSPITAL_COMMUNITY)
Admission: RE | Admit: 2019-12-03 | Discharge: 2019-12-03 | Disposition: A | Discharge status: Home / Self Care | Payer: 59 | Source: Ambulatory Visit | Attending: Internal Medicine | Admitting: Internal Medicine

## 2019-12-03 ENCOUNTER — Other Ambulatory Visit: Payer: Self-pay

## 2019-12-03 DIAGNOSIS — Z20822 Contact with and (suspected) exposure to covid-19: Secondary | ICD-10-CM | POA: Diagnosis not present

## 2019-12-03 DIAGNOSIS — Z01812 Encounter for preprocedural laboratory examination: Secondary | ICD-10-CM | POA: Diagnosis not present

## 2019-12-03 LAB — SARS CORONAVIRUS 2 (TAT 6-24 HRS): SARS Coronavirus 2: NEGATIVE

## 2019-12-04 ENCOUNTER — Encounter (HOSPITAL_COMMUNITY): Payer: Self-pay | Admitting: Internal Medicine

## 2019-12-04 ENCOUNTER — Other Ambulatory Visit: Payer: Self-pay

## 2019-12-04 ENCOUNTER — Ambulatory Visit (HOSPITAL_COMMUNITY)
Admission: RE | Admit: 2019-12-04 | Discharge: 2019-12-04 | Disposition: A | Discharge status: Home / Self Care | Payer: 59 | Attending: Internal Medicine | Admitting: Internal Medicine

## 2019-12-04 ENCOUNTER — Encounter (HOSPITAL_COMMUNITY)
Admission: RE | Disposition: A | Discharge status: Home / Self Care | Payer: Self-pay | Source: Home / Self Care | Attending: Internal Medicine

## 2019-12-04 DIAGNOSIS — Z86010 Personal history of colonic polyps: Secondary | ICD-10-CM | POA: Diagnosis not present

## 2019-12-04 DIAGNOSIS — Z09 Encounter for follow-up examination after completed treatment for conditions other than malignant neoplasm: Secondary | ICD-10-CM | POA: Insufficient documentation

## 2019-12-04 DIAGNOSIS — Z79890 Hormone replacement therapy (postmenopausal): Secondary | ICD-10-CM | POA: Diagnosis not present

## 2019-12-04 DIAGNOSIS — Z87891 Personal history of nicotine dependence: Secondary | ICD-10-CM | POA: Insufficient documentation

## 2019-12-04 DIAGNOSIS — Z85118 Personal history of other malignant neoplasm of bronchus and lung: Secondary | ICD-10-CM | POA: Insufficient documentation

## 2019-12-04 HISTORY — PX: COLONOSCOPY: SHX5424

## 2019-12-04 SURGERY — COLONOSCOPY
Anesthesia: Moderate Sedation

## 2019-12-04 MED ORDER — MEPERIDINE HCL 50 MG/ML IJ SOLN
INTRAMUSCULAR | Status: AC
  Filled 2019-12-04: qty 1

## 2019-12-04 MED ORDER — MIDAZOLAM HCL 5 MG/5ML IJ SOLN
INTRAMUSCULAR | Status: DC | PRN
  Administered 2019-12-04 (×2): 2 mg via INTRAVENOUS

## 2019-12-04 MED ORDER — MEPERIDINE HCL 100 MG/ML IJ SOLN
INTRAMUSCULAR | Status: DC | PRN
  Administered 2019-12-04: 25 mg
  Administered 2019-12-04: 15 mg

## 2019-12-04 MED ORDER — MIDAZOLAM HCL 5 MG/5ML IJ SOLN
INTRAMUSCULAR | Status: AC
  Filled 2019-12-04: qty 10

## 2019-12-04 MED ORDER — SODIUM CHLORIDE 0.9 % IV SOLN: INTRAVENOUS | Status: DC

## 2019-12-04 MED ORDER — ONDANSETRON HCL 4 MG/2ML IJ SOLN
INTRAMUSCULAR | Status: AC
  Filled 2019-12-04: qty 2

## 2019-12-04 MED ORDER — STERILE WATER FOR IRRIGATION IR SOLN: Status: DC | PRN

## 2019-12-04 MED ORDER — ONDANSETRON HCL 4 MG/2ML IJ SOLN
INTRAMUSCULAR | Status: DC | PRN
  Administered 2019-12-04: 4 mg via INTRAVENOUS

## 2019-12-04 NOTE — Op Note (Signed)
[facility] Patient Name: Tanya Woods Procedure Date: 12/04/2019 8:15 AM MRN: [id number] Date of Birth: Dec 16, 1965 Attending MD: Norvel Richards , MD CSN: [id number] Age: 54 Admit Type: Outpatient Procedure:                Colonoscopy Indications:              High risk colon cancer surveillance: Personal                            history of colonic polyps; piecemeal removal large                            cecal polyp last year Providers:                Norvel Richards, MD, Janeece Riggers, RN, Raphael Gibney, Technician Referring MD:              Medicines:                Midazolam 4 mg IV, Meperidine 40 mg IV Complications:            No immediate complications. Estimated Blood Loss:     Estimated blood loss: none. Procedure:                Pre-Anesthesia Assessment:                           - Prior to the procedure, a History and Physical                            was performed, and patient medications and                            allergies were reviewed. The patient's tolerance of                            previous anesthesia was also reviewed. The risks                            and benefits of the procedure and the sedation                            options and risks were discussed with the patient.                            All questions were answered, and informed consent                            was obtained. Prior Anticoagulants: The patient has                            taken no previous anticoagulant or antiplatelet  agents. ASA Grade Assessment: II - A patient with                            mild systemic disease. After reviewing the risks                            and benefits, the patient was deemed in                            satisfactory condition to undergo the procedure.                           After obtaining informed consent, the colonoscope                            was passed  under direct vision. Throughout the                            procedure, the patient's blood pressure, pulse, and                            oxygen saturations were monitored continuously. The                            CF-HQ190L ([id number]) scope was introduced through                            the anus and advanced to the the cecum, identified                            by appendiceal orifice and ileocecal valve. The                            colonoscopy was performed without difficulty. The                            patient tolerated the procedure well. The quality                            of the bowel preparation was adequate. Scope In: 8:33:57 AM Scope Out: 8:45:35 AM Scope Withdrawal Time: 0 hours 7 minutes 47 seconds  Total Procedure Duration: 0 hours 11 minutes 38 seconds  Findings:      The perianal and digital rectal examinations were normal.      The colon (entire examined portion) appeared normal. No evidence of       residual cecal scar or recurrent polyp.      The retroflexed view of the distal rectum and anal verge was normal and       showed no anal or rectal abnormalities aside from chronically inflamed       appearing anal papilla (previously biopsied). Impression:               - The entire examined colon is normal. Anorectal  inflamed appearing papilla                           - The distal rectum and anal verge are normal on                            retroflexion view.                           - No specimens collected. Moderate Sedation:      Moderate (conscious) sedation was administered by the endoscopy nurse       and supervised by the endoscopist. The following parameters were       monitored: oxygen saturation, heart rate, blood pressure, respiratory       rate, EKG, adequacy of pulmonary ventilation, and response to care.       Total physician intraservice time was 18 minutes. Recommendation:           - Patient has a contact  number available for                            emergencies. The signs and symptoms of potential                            delayed complications were discussed with the                            patient. Return to normal activities tomorrow.                            Written discharge instructions were provided to the                            patient.                           - Advance diet as tolerated. Repeat colonoscopy for                            surveillance purposes 5 years Procedure Code(s):        --- Professional ---                           [phone], Colonoscopy, flexible; diagnostic, including                            collection of specimen(s) by brushing or washing,                            when performed (separate procedure)                           G0500, Moderate sedation services provided by the                            same physician or other qualified health care  professional performing a gastrointestinal                            endoscopic service that sedation supports,                            requiring the presence of an independent trained                            observer to assist in the monitoring of the                            patient's level of consciousness and physiological                            status; initial 15 minutes of intra-service time;                            patient age 83 years or older (additional time may                            be reported with [phone], as appropriate) Diagnosis Code(s):        --- Professional ---                           Z86.010, Personal history of colonic polyps CPT copyright 2019 American Medical Association. All rights reserved. The codes documented in this report are preliminary and upon coder review may  be revised to meet current compliance requirements. Cristopher Estimable. Mannie Wineland, MD Norvel Richards, MD 12/04/2019 8:54:59 AM This report has been signed  electronically. Number of Addenda: 0

## 2019-12-04 NOTE — H&P (Signed)
@LOGO @   Primary Care Physician:  Caryl Bis, MD Primary Gastroenterologist:  Dr. Gala Romney  Pre-Procedure History & Physical: HPI:  Tanya Woods is a 54 y.o. female here for surveillance colonoscopy.  History of a large polyp removed from the cecum piecemeal last year.  Here for early surveillance per plan.  Past Medical History:  Diagnosis Date  . Complication of anesthesia   . GERD (gastroesophageal reflux disease)   . Hypothyroidism   . Non-small cell lung cancer ([place]) 2016    Past Surgical History:  Procedure Laterality Date  . BIOPSY  01/15/2019   Procedure: BIOPSY;  Surgeon: Daneil Dolin, MD;  Location: [facility];  Service: Endoscopy;;  . COLONOSCOPY N/A 01/15/2019   Procedure: COLONOSCOPY;  Surgeon: Daneil Dolin, MD; 2 polyps in the descending colon and cecum (hyperplastic), one 14 mm polyp in cecum (sessile serrated polyp), nodule in distal rectum (inflammatory polyp).  Recommended repeat colonoscopy in 6 months.  [facility] LUNG CANCER SURGERY Right    [facility]   . LUNG SURGERY     wedge surgery left side   . POLYPECTOMY  01/15/2019   Procedure: POLYPECTOMY;  Surgeon: Daneil Dolin, MD;  Location: [facility];  Service: Endoscopy;;    Prior to Admission medications   Medication Sig Start Date End Date Taking? Authorizing Provider  acetaminophen (TYLENOL) 500 MG tablet Take 1,000 mg by mouth every 6 (six) hours as needed for moderate pain or headache.    Yes [provider]  diphenhydrAMINE (BENADRYL ALLERGY) 25 mg capsule Take 25 mg by mouth daily.    Yes [provider]  ibuprofen (ADVIL) 200 MG tablet Take 200 mg by mouth every 6 (six) hours as needed for headache or moderate pain.   Yes [provider]  levothyroxine (SYNTHROID) 88 MCG tablet Take 88 mcg by mouth daily before breakfast.   Yes [provider]  Multiple Vitamins-Minerals (MULTIVITAMIN ADULT) TABS Take 1 tablet by mouth daily.    Yes [provider]  omeprazole (PRILOSEC) 20 MG capsule Take 20 mg by mouth daily.   Yes [provider]    Allergies as of 10/03/2019 - Review Complete 09/20/2019  Allergen Reaction Noted  . Morphine and related Nausea Only 10/22/2018  . Tramadol Nausea Only 10/22/2018    Family History  Problem Relation Age of Onset  . Colon polyps Father   . Colon cancer Neg Hx     Social History   Socioeconomic History  . Marital status: Married    Spouse name: Not on file  . Number of children: Not on file  . Years of education: Not on file  . Highest education level: Not on file  Occupational History  . Not on file  Tobacco Use  . Smoking status: Former Research scientist (life sciences)  . Smokeless tobacco: Never Used  Vaping Use  . Vaping Use: Never used  Substance and Sexual Activity  . Alcohol use: Not Currently    Comment: occasionally  . Drug use: Never  . Sexual activity: Not on file  Other Topics Concern  . Not on file  Social History Narrative  . Not on file   Social Determinants of Health   Financial Resource Strain:   . Difficulty of Paying Living Expenses: Not on file  Food Insecurity:   . Worried About Charity fundraiser in the Last Year: Not on file  . Ran Out of Food in the Last Year: Not on file  Transportation Needs:   .  Lack of Transportation (Medical): Not on file  . Lack of Transportation (Non-Medical): Not on file  Physical Activity:   . Days of Exercise per Week: Not on file  . Minutes of Exercise per Session: Not on file  Stress:   . Feeling of Stress : Not on file  Social Connections:   . Frequency of Communication with Friends and Family: Not on file  . Frequency of Social Gatherings with Friends and Family: Not on file  . Attends Religious Services: Not on file  . Active Member of Clubs or Organizations: Not on file  . Attends Archivist Meetings: Not on file  . Marital Status: Not on file  Intimate Partner Violence:   . Fear of Current or  Ex-Partner: Not on file  . Emotionally Abused: Not on file  . Physically Abused: Not on file  . Sexually Abused: Not on file    Review of Systems: See HPI, otherwise negative ROS  Physical Exam: BP 119/72   Pulse 78   Temp 98 F (36.7 C) (Oral)   Resp 20   Ht 5\' 6"  (1.676 m)   Wt 80.3 kg   SpO2 98%   BMI 28.57 kg/m  General:   Alert,  Well-developed, well-nourished, pleasant and cooperative in NAD Mouth:  No deformity or lesions. Neck:  Supple; no masses or thyromegaly. No significant cervical adenopathy. Lungs:  Clear throughout to auscultation.   No wheezes, crackles, or rhonchi. No acute distress. Heart:  Regular rate and rhythm; no murmurs, clicks, rubs,  or gallops. Abdomen: Non-distended, normal bowel sounds.  Soft and nontender without appreciable mass or hepatosplenomegaly.  Pulses:  Normal pulses noted. Extremities:  Without clubbing or edema.  Impression/Plan: 54 year old lady here for surveillance colonoscopy per plan. The risks, benefits, limitations, alternatives and imponderables have been reviewed with the patient. Questions have been answered. All parties are agreeable.      Notice: This dictation was prepared with Dragon dictation along with smaller phrase technology. Any transcriptional errors that result from this process are unintentional and may not be corrected upon review.

## 2019-12-04 NOTE — Discharge Instructions (Signed)
  Colonoscopy Discharge Instructions  Read the instructions outlined below and refer to this sheet in the next few weeks. These discharge instructions provide you with general information on caring for yourself after you leave the hospital. Your doctor may also give you specific instructions. While your treatment has been planned according to the most current medical practices available, unavoidable complications occasionally occur. If you have any problems or questions after discharge, call Dr. Gala Romney at [phone]. ACTIVITY  You may resume your regular activity, but move at a slower pace for the next 24 hours.   Take frequent rest periods for the next 24 hours.   Walking will help get rid of the air and reduce the bloated feeling in your belly (abdomen).   No driving for 24 hours (because of the medicine (anesthesia) used during the test).    Do not sign any important legal documents or operate any machinery for 24 hours (because of the anesthesia used during the test).  NUTRITION  Drink plenty of fluids.   You may resume your normal diet as instructed by your doctor.   Begin with a light meal and progress to your normal diet. Heavy or fried foods are harder to digest and may make you feel sick to your stomach (nauseated).   Avoid alcoholic beverages for 24 hours or as instructed.  MEDICATIONS  You may resume your normal medications unless your doctor tells you otherwise.  WHAT YOU CAN EXPECT TODAY  Some feelings of bloating in the abdomen.   Passage of more gas than usual.   Spotting of blood in your stool or on the toilet paper.  IF YOU HAD POLYPS REMOVED DURING THE COLONOSCOPY:  No aspirin products for 7 days or as instructed.   No alcohol for 7 days or as instructed.   Eat a soft diet for the next 24 hours.  FINDING OUT THE RESULTS OF YOUR TEST Not all test results are available during your visit. If your test results are not back during the visit, make an appointment  with your caregiver to find out the results. Do not assume everything is normal if you have not heard from your caregiver or the medical facility. It is important for you to follow up on all of your test results.  SEEK IMMEDIATE MEDICAL ATTENTION IF:  You have more than a spotting of blood in your stool.   Your belly is swollen (abdominal distention).   You are nauseated or vomiting.   You have a temperature over 101.   You have abdominal pain or discomfort that is severe or gets worse throughout the day.    Your colon looked good today.  No evidence of residual polyp.  I recommend you return in 5 years for repeat colonoscopy  At patient request, I called Melanie Crazier at [phone]-reviewed findings and recommendations

## 2019-12-10 ENCOUNTER — Encounter (HOSPITAL_COMMUNITY): Payer: Self-pay | Admitting: Internal Medicine
